# Patient Record
Sex: Male | Born: 1975 | Race: Black or African American | Hispanic: No | Marital: Married | Smoking: Never smoker
Health system: Southern US, Community
[De-identification: ages and names within clinical notes are randomized; demographics above are authoritative.]

---

## 2006-06-09 ENCOUNTER — Emergency Department (HOSPITAL_COMMUNITY): Admission: EM | Admit: 2006-06-09 | Discharge: 2006-06-09 | Payer: Self-pay | Admitting: Emergency Medicine
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
## 2009-04-26 ENCOUNTER — Encounter: Admission: RE | Admit: 2009-04-26 | Discharge: 2009-04-26 | Payer: Self-pay | Admitting: Specialist
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
## 2011-05-26 ENCOUNTER — Ambulatory Visit
Admission: RE | Admit: 2011-05-26 | Discharge: 2011-05-26 | Disposition: A | Discharge status: Home / Self Care | Payer: No Typology Code available for payment source | Source: Ambulatory Visit | Attending: Specialist | Admitting: Specialist

## 2011-05-26 ENCOUNTER — Other Ambulatory Visit: Payer: Self-pay | Admitting: Specialist

## 2011-05-26 DIAGNOSIS — R7611 Nonspecific reaction to tuberculin skin test without active tuberculosis: Secondary | ICD-10-CM

## 2013-09-09 ENCOUNTER — Encounter: Payer: Self-pay | Admitting: Family Medicine

## 2022-04-14 DIAGNOSIS — Y92410 Unspecified street and highway as the place of occurrence of the external cause: Secondary | ICD-10-CM | POA: Insufficient documentation

## 2022-04-14 DIAGNOSIS — M545 Low back pain, unspecified: Secondary | ICD-10-CM | POA: Diagnosis not present

## 2022-04-14 DIAGNOSIS — M542 Cervicalgia: Secondary | ICD-10-CM | POA: Insufficient documentation

## 2022-04-14 DIAGNOSIS — R21 Rash and other nonspecific skin eruption: Secondary | ICD-10-CM | POA: Diagnosis not present

## 2022-04-15 ENCOUNTER — Emergency Department (HOSPITAL_COMMUNITY): Payer: No Typology Code available for payment source

## 2022-04-15 ENCOUNTER — Encounter (HOSPITAL_COMMUNITY): Payer: Self-pay | Admitting: Emergency Medicine

## 2022-04-15 ENCOUNTER — Other Ambulatory Visit: Payer: Self-pay

## 2022-04-15 ENCOUNTER — Emergency Department (HOSPITAL_COMMUNITY)
Admission: EM | Admit: 2022-04-15 | Discharge: 2022-04-15 | Disposition: A | Discharge status: Home / Self Care | Payer: No Typology Code available for payment source | Attending: Emergency Medicine | Admitting: Emergency Medicine

## 2022-04-15 DIAGNOSIS — R21 Rash and other nonspecific skin eruption: Secondary | ICD-10-CM

## 2022-04-15 MED ORDER — HYDROCORTISONE 1 % EX CREA: TOPICAL_CREAM | CUTANEOUS | 0 refills | Status: AC

## 2022-04-15 MED ORDER — LIDOCAINE 5 % EX PTCH: 1.0000 | MEDICATED_PATCH | CUTANEOUS | 0 refills | Status: AC

## 2022-04-15 MED ORDER — IBUPROFEN 800 MG PO TABS
800.0000 mg | ORAL_TABLET | Freq: Once | ORAL | Status: AC
  Administered 2022-04-15: 800 mg via ORAL
  Filled 2022-04-15: qty 1

## 2022-04-15 MED ORDER — NAPROXEN 375 MG PO TABS: 375.0000 mg | ORAL_TABLET | Freq: Two times a day (BID) | ORAL | 0 refills | Status: AC
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
## 2022-04-15 NOTE — Discharge Instructions (Signed)
Your work-up here is reassuring.  No signs of fractures or dislocations in your bones which is great news.  I have written you 3 prescriptions.  Hydrocortisone cream for the rash on your left arm, naproxen for pain, and lidocaine patches for local pain control of your back. ? ?Please return to the emergency department for any other red flag symptoms that we discussed today. ?
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
